# Patient Record
Sex: Male | Born: 1955 | Race: Black or African American | Hispanic: No | Marital: Single
Health system: Southern US, Community
[De-identification: ages and names within clinical notes are randomized; demographics above are authoritative.]

---

## 2019-11-15 ENCOUNTER — Emergency Department (HOSPITAL_COMMUNITY): Payer: Medicare HMO

## 2019-11-15 ENCOUNTER — Emergency Department (HOSPITAL_COMMUNITY)
Admission: EM | Admit: 2019-11-15 | Discharge: 2019-11-15 | Disposition: A | Discharge status: Home / Self Care | Payer: Medicare HMO | Attending: Emergency Medicine | Admitting: Emergency Medicine

## 2019-11-15 ENCOUNTER — Encounter (HOSPITAL_COMMUNITY): Payer: Self-pay

## 2019-11-15 ENCOUNTER — Other Ambulatory Visit: Payer: Self-pay

## 2019-11-15 DIAGNOSIS — Y939 Activity, unspecified: Secondary | ICD-10-CM | POA: Diagnosis not present

## 2019-11-15 DIAGNOSIS — S61302A Unspecified open wound of right middle finger with damage to nail, initial encounter: Secondary | ICD-10-CM | POA: Diagnosis not present

## 2019-11-15 DIAGNOSIS — Z23 Encounter for immunization: Secondary | ICD-10-CM | POA: Diagnosis not present

## 2019-11-15 DIAGNOSIS — W231XXA Caught, crushed, jammed, or pinched between stationary objects, initial encounter: Secondary | ICD-10-CM | POA: Insufficient documentation

## 2019-11-15 DIAGNOSIS — Y9259 Other trade areas as the place of occurrence of the external cause: Secondary | ICD-10-CM | POA: Diagnosis not present

## 2019-11-15 DIAGNOSIS — Y999 Unspecified external cause status: Secondary | ICD-10-CM | POA: Diagnosis not present

## 2019-11-15 DIAGNOSIS — S61319A Laceration without foreign body of unspecified finger with damage to nail, initial encounter: Secondary | ICD-10-CM

## 2019-11-15 DIAGNOSIS — I10 Essential (primary) hypertension: Secondary | ICD-10-CM | POA: Insufficient documentation

## 2019-11-15 DIAGNOSIS — S61312A Laceration without foreign body of right middle finger with damage to nail, initial encounter: Secondary | ICD-10-CM | POA: Insufficient documentation

## 2019-11-15 DIAGNOSIS — S62639B Displaced fracture of distal phalanx of unspecified finger, initial encounter for open fracture: Secondary | ICD-10-CM

## 2019-11-15 MED ORDER — BUPIVACAINE HCL (PF) 0.5 % IJ SOLN
10.0000 mL | Freq: Once | INTRAMUSCULAR | Status: AC
  Administered 2019-11-15: 10 mL
  Filled 2019-11-15: qty 30

## 2019-11-15 MED ORDER — CEPHALEXIN 500 MG PO CAPS: 500.0000 mg | ORAL_CAPSULE | Freq: Three times a day (TID) | ORAL | 0 refills | Status: AC

## 2019-11-15 MED ORDER — LIDOCAINE HCL (PF) 1 % IJ SOLN
5.0000 mL | Freq: Once | INTRAMUSCULAR | Status: AC
  Administered 2019-11-15: 5 mL
  Filled 2019-11-15: qty 30

## 2019-11-15 MED ORDER — CEPHALEXIN 500 MG PO CAPS
500.0000 mg | ORAL_CAPSULE | Freq: Once | ORAL | Status: AC
  Administered 2019-11-15: 500 mg via ORAL
  Filled 2019-11-15: qty 1

## 2019-11-15 MED ORDER — TETANUS-DIPHTH-ACELL PERTUSSIS 5-2.5-18.5 LF-MCG/0.5 IM SUSP
0.5000 mL | Freq: Once | INTRAMUSCULAR | Status: AC
  Administered 2019-11-15: 0.5 mL via INTRAMUSCULAR
  Filled 2019-11-15: qty 0.5

## 2019-11-15 NOTE — ED Triage Notes (Signed)
Pt cut his R middle finger in a folding chair. Pt is intoxicated. Deep laceration noted and fingernail has started to come away from finger.

## 2019-11-15 NOTE — ED Notes (Signed)
Pt given bus pass and printed bus route to get back to his hotel.

## 2019-11-15 NOTE — ED Notes (Signed)
Pt declined dc vitals.

## 2019-11-15 NOTE — ED Provider Notes (Signed)
[facility]-EMERGENCY DEPT Provider Note   CSN: [id number] Arrival date & time: 11/15/19  0107     History Chief Complaint  Patient presents with  . Finger Injury    R middle    Jerome Chan is a 64 y.o. male with a history of hypertension & osteoarthritis who presents to the ED with complaints of R 3rd finger injury which occurred around midnight. Patient states he was closing a folding chair and got the tip of his finger pinched in it resulting in a laceration. Patient states the area is not overly painful. Had some continued bleeding therefore came to the ED for assessment. No alleviating/aggravating factors. He is L hand dominant. Denies numbness, tingling, weakness, or other areas of injury. Unknown last tetanus. Has been drinking alcohol tonight. Last ate yesterday AM, Last drink was shortly PTA.   HPI     History reviewed. No pertinent past medical history.  There are no problems to display for this patient.   History reviewed. No pertinent surgical history.     History reviewed. No pertinent family history.  Social History   Tobacco Use  . Smoking status: Not on file  Substance Use Topics  . Alcohol use: Not on file  . Drug use: Not on file    Home Medications Prior to Admission medications   Not on File    Allergies    Patient has no known allergies.  Review of Systems   Review of Systems  Constitutional: Negative for chills and fever.  Respiratory: Negative for shortness of breath.   Cardiovascular: Negative for chest pain.  Musculoskeletal: Negative for arthralgias.  Skin: Positive for wound.  Neurological: Negative for weakness, numbness and headaches.  All other systems reviewed and are negative.   Physical Exam Updated Vital Signs BP 103/88 (BP Location: Left Arm)   Pulse 84   Temp 98.5 F (36.9 C) (Oral)   Resp 18   Ht 5\' 7"  (1.702 m)   Wt 93 kg   SpO2 96%   BMI 32.11 kg/m   Physical Exam Vitals and  nursing note reviewed.  Constitutional:      General: He is not in acute distress.    Appearance: Normal appearance. He is not ill-appearing or toxic-appearing.  HENT:     Head: Normocephalic and atraumatic.  Neck:     Comments: No midline tenderness.  Cardiovascular:     Rate and Rhythm: Normal rate.     Pulses:          Radial pulses are 2+ on the right side and 2+ on the left side.  Pulmonary:     Effort: Pulmonary effort is normal. No respiratory distress.  Musculoskeletal:     Cervical back: Normal range of motion and neck supple.     Comments: Upper extremities: R 3rd finger: Patient has avulsed the distal aspect of the nailbed with 3cm laceration through the nailbed extending to the radial aspect of the digit and to the tip of the digit. Pulsatile bleeding noted to the radial aspect of the nailbed- pressure applied.  Able to visualize and palpate bone within laceration.  He is able to move all joints. Tenderness to distal phalanx.   Skin:    General: Skin is warm and dry.  Neurological:     Mental Status: He is alert.     Comments: Alert. Mildly slurred speech at times. Sensation grossly intact to bilateral upper extremities. 5/5 symmetric grip strength. Ambulatory.   Psychiatric:  Mood and Affect: Mood normal.        Behavior: Behavior normal.             ED Results / Procedures / Treatments   Labs (all labs ordered are listed, but only abnormal results are displayed) Labs Reviewed - No data to display  EKG None  Radiology DG Finger Middle Right  Result Date: 11/15/2019 CLINICAL DATA:  Folding chair injury, deep laceration EXAM: RIGHT MIDDLE FINGER 2+V COMPARISON:  None. FINDINGS: There is extensive overlying laceration difficult to fully assess given saturated bandaging material. A small crescentic fracture of the third distal phalangeal tuft, possibly open fracture injury with disruption of the nail plate. No other acute fracture or traumatic osseous  abnormality is seen. IMPRESSION: 1. Extensive overlying laceration, difficult to fully assess given saturated bandaging material. 2. Crescentic fracture of the third distal phalangeal tuft, correlate for open fracture. 3. Disruption of the nail plate. Electronically Signed   By: Kreg ShropshirePrice  DeHay M.D.   On: 11/15/2019 01:40    Procedures .Nail Removal  Date/Time: 11/15/2019 3:22 AM Performed by: Cherly AndersonPetrucelli, Monseratt Ledin R, PA-C Authorized by: Cherly AndersonPetrucelli, Bryar Dahms R, PA-C   Consent:    Consent obtained:  Verbal   Consent given by:  Patient   Risks discussed:  Bleeding, incomplete removal, infection, permanent nail deformity and pain   Alternatives discussed:  No treatment Location:    Hand:  R long finger Pre-procedure details:    Skin preparation:  Alcohol and Betadine   Preparation: Patient was prepped and draped in the usual sterile fashion   Anesthesia (see MAR for exact dosages):    Anesthesia method:  Nerve block   Block needle gauge:  27 G   Block anesthetic:  Lidocaine 1% w/o epi and bupivacaine 0.5% w/o epi   Block injection procedure:  Anatomic landmarks identified, anatomic landmarks palpated, negative aspiration for blood, introduced needle and incremental injection   Block outcome:  Anesthesia achieved Nail Removal:    Nail removed:  Complete   Nail bed repaired: yes     Nail bed suture material: See laceration repair procedure note.   Removed nail replaced and anchored: yes   Post-procedure details:    Patient tolerance of procedure:  Tolerated well, no immediate complications  .Marland Kitchen.Laceration Repair  Date/Time: 11/15/2019 3:24 AM Performed by: Cherly AndersonPetrucelli, Charod Slawinski R, PA-C Authorized by: Cherly AndersonPetrucelli, Macarena Langseth R, PA-C   Consent:    Consent obtained:  Verbal   Consent given by:  Patient   Risks discussed:  Infection, need for additional repair, nerve damage, poor wound healing, poor cosmetic result, pain, retained foreign body, tendon damage and vascular damage   Alternatives  discussed:  No treatment Anesthesia (see MAR for exact dosages):    Anesthesia method:  Nerve block   Block needle gauge:  27 G   Block anesthetic:  Lidocaine 1% w/o epi and bupivacaine 0.5% w/o epi   Block injection procedure:  Anatomic landmarks identified, anatomic landmarks palpated, negative aspiration for blood, introduced needle and incremental injection   Block outcome:  Anesthesia achieved Laceration details:    Location:  Finger   Finger location:  R long finger   Length (cm):  3   Depth (mm):  5 Repair type:    Repair type:  Intermediate Pre-procedure details:    Preparation:  Patient was prepped and draped in usual sterile fashion and imaging obtained to evaluate for foreign bodies Exploration:    Hemostasis achieved with:  Direct pressure   Wound exploration: wound explored  through full range of motion and entire depth of wound probed and visualized     Wound extent comment:  Nailbed laceration    Contaminated: no   Treatment:    Area cleansed with:  Betadine   Irrigation solution:  Sterile water   Irrigation volume:  1L   Irrigation method:  Pressure wash Skin repair:    Repair method:  Sutures   Suture size:  5-0   Wound skin closure material used: Vicryl rapide.   Suture technique:  Simple interrupted   Number of sutures:  10 Approximation:    Approximation:  Close Post-procedure details:    Dressing: Nail replaced. non-stick dressing applied. Splint.   Patient tolerance of procedure:  Tolerated well, no immediate complications   (including critical care time)  Medications Ordered in ED Medications  lidocaine (PF) (XYLOCAINE) 1 % injection 5 mL (has no administration in time range)  bupivacaine (MARCAINE) 0.5 % injection 10 mL (has no administration in time range)  Tdap (BOOSTRIX) injection 0.5 mL (has no administration in time range)    ED Course  I have reviewed the triage vital signs and the nursing notes.  Pertinent labs & imaging results that  were available during my care of the patient were reviewed by me and considered in my medical decision making (see chart for details).    MDM Rules/Calculators/A&P                         Patient presents to the ED with complaints of R 3rd finger injury.  Tourniquet applied briefly for more thorough assessment & for images above, removed.  Imaging Studies ordered:  I ordered imaging studies which included R 3rd finger x-ray, I independently visualized and interpreted imaging which showed 1. Extensive overlying laceration, difficult to fully assess given saturated bandaging material. 2. Crescentic fracture of the third distal phalangeal tuft, correlate for open fracture. 3. Disruption of the nail plate.   Findings consistent w/ open distal phalanx tuft fracture with nailbed laceration and findings concerning for small arterial bleed. Will discuss with hand surgery on call. Discussed with supervising physician Dr. Adela Lank who is in agreement.   02:21: CONSULT: Discussed case with hand surgeon Dr. Janee Morn, recommends nail removal, laceration repair with absorbable sutures, and discharged home on Keflex for a 2-3 days.  His office will contact for follow-up appointment.  Appreciate consultation.  Wound irrigated in a bloodless field without evidence of foreign body.  With application of pressure and observation bleeding has resolved.  Repair per procedure note above.  Tolerated well. I discussed results, treatment plan, need for follow-up, and return precautions with the patient. Provided opportunity for questions, patient confirmed understanding and is in agreement with plan.  Findings and plan of care discussed with supervising physician Dr. Adela Lank who is in agreement.   Portions of this note were generated with Scientist, clinical (histocompatibility and immunogenetics). Dictation errors may occur despite best attempts at proofreading.   Final Clinical Impression(s) / ED Diagnoses Final diagnoses:  Open fracture of tuft of distal  phalanx of finger  Laceration of nail bed of finger, initial encounter    Rx / DC Orders ED Discharge Orders         Ordered    cephALEXin (KEFLEX) 500 MG capsule  3 times daily     Discontinue  Reprint     11/15/19 0331           Cherly Anderson, PA-C 11/15/19 [phone]  Melene Plan, DO 11/16/19 [phone]

## 2019-11-15 NOTE — Discharge Instructions (Addendum)
You were seen in the emergency department for a right third finger injury.  Your x-ray showed a fracture.  Your nail was removed and your laceration was repaired with absorbable sutures, these will dissolve on their own.  We have replaced the nail.  We have placed you in a splint.  Please keep the splint in place at all times until you have followed up with Dr. Janee Morn, hand surgeon in your discharge instructions.  They will call you to schedule a follow-up appointment.  We are sending you home with Keflex to take 3 times per day for the next 3 days to help prevent infection.  We have prescribed you new medication(s) today. Discuss the medications prescribed today with your pharmacist as they can have adverse effects and interactions with your other medicines including over the counter and prescribed medications. Seek medical evaluation if you start to experience new or abnormal symptoms after taking one of these medicines, seek care immediately if you start to experience difficulty breathing, feeling of your throat closing, facial swelling, or rash as these could be indications of a more serious allergic reaction  Please follow-up with Dr. Janee Morn as discussed above.  Return to the ER for new or worsening symptoms including but not limited to redness around the wound, pus draining from the wound, fever, chills, increased pain, or any other concerns.

## 2020-10-07 IMAGING — CR DG FINGER MIDDLE 2+V*R*
3 series · 3 of 3 positions shown · non-contrast
Comparison: None.

CLINICAL DATA: Folding chair injury, deep laceration

EXAM:
RIGHT MIDDLE FINGER 2+V

[x finger pa right]
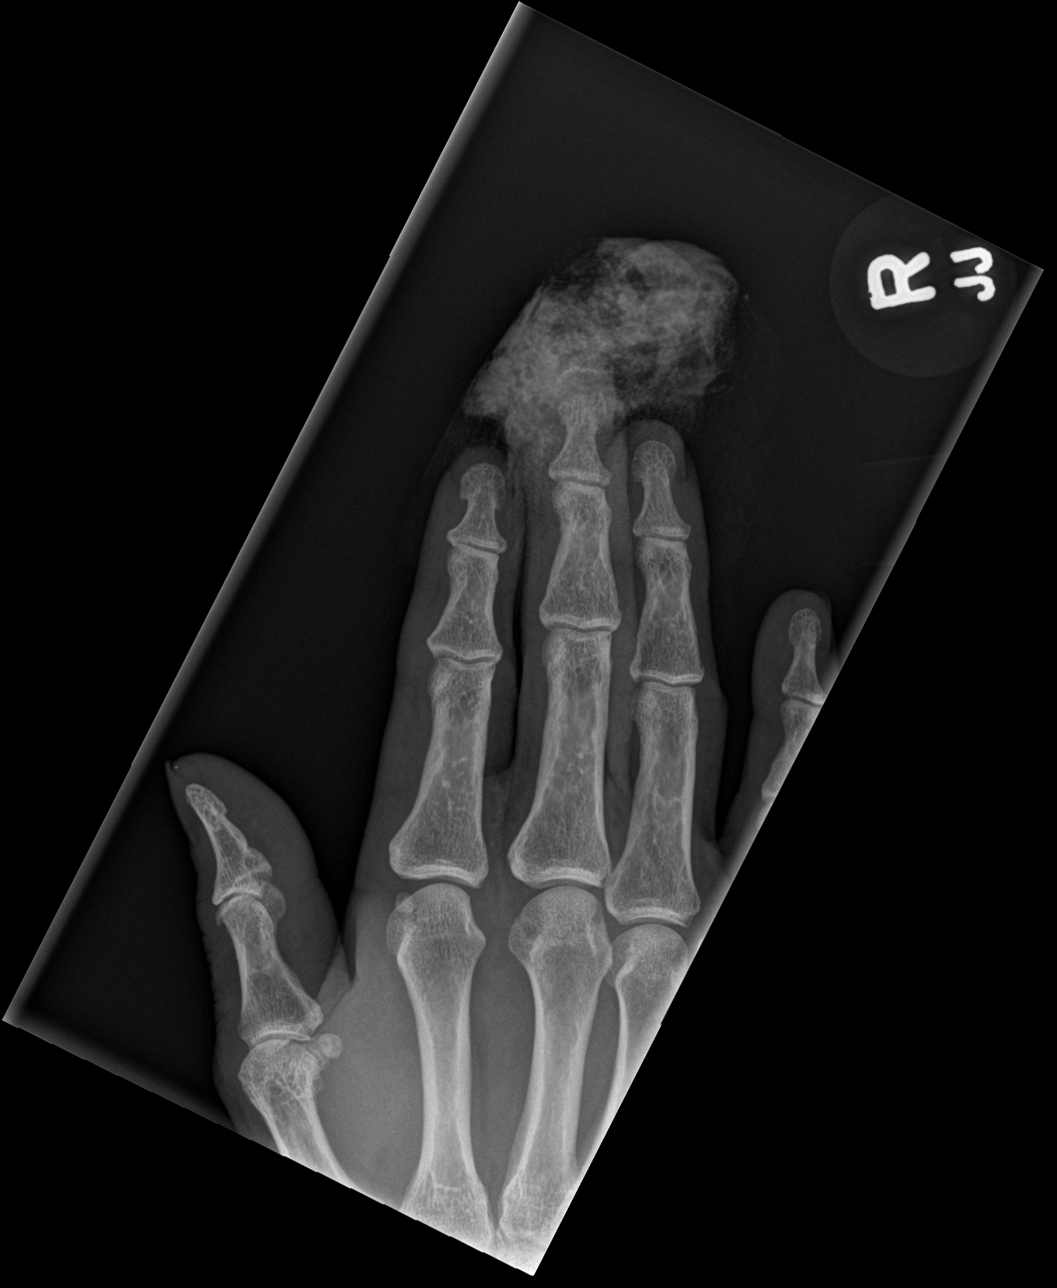

[x finger obl right]
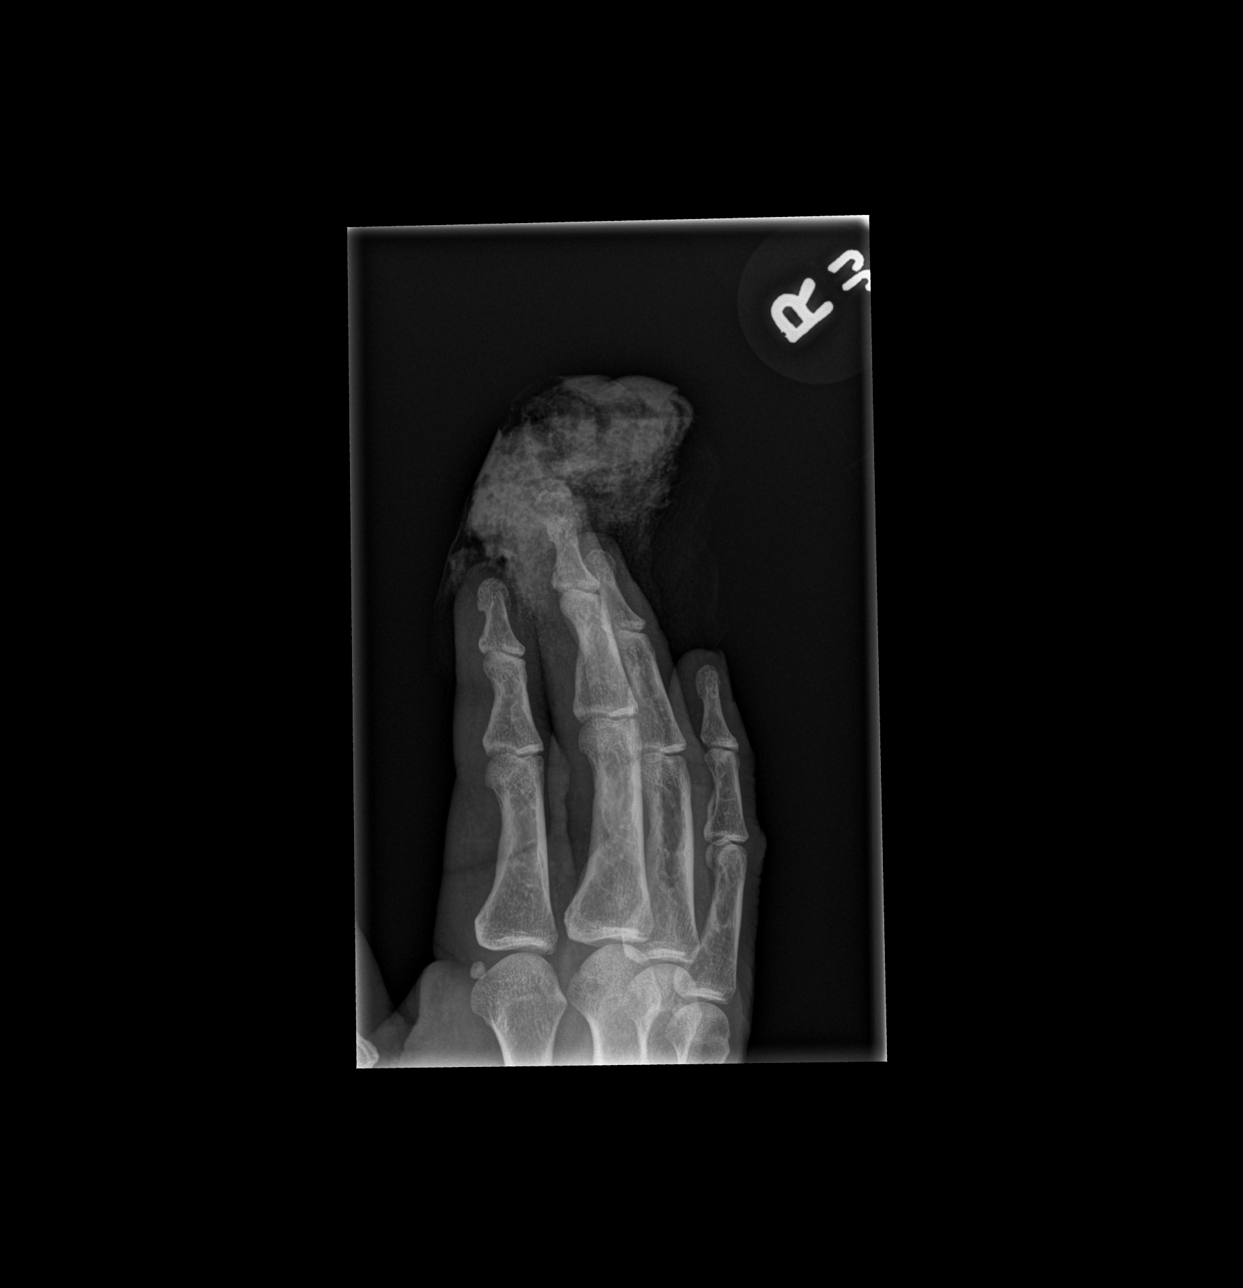

[x finger lat right]
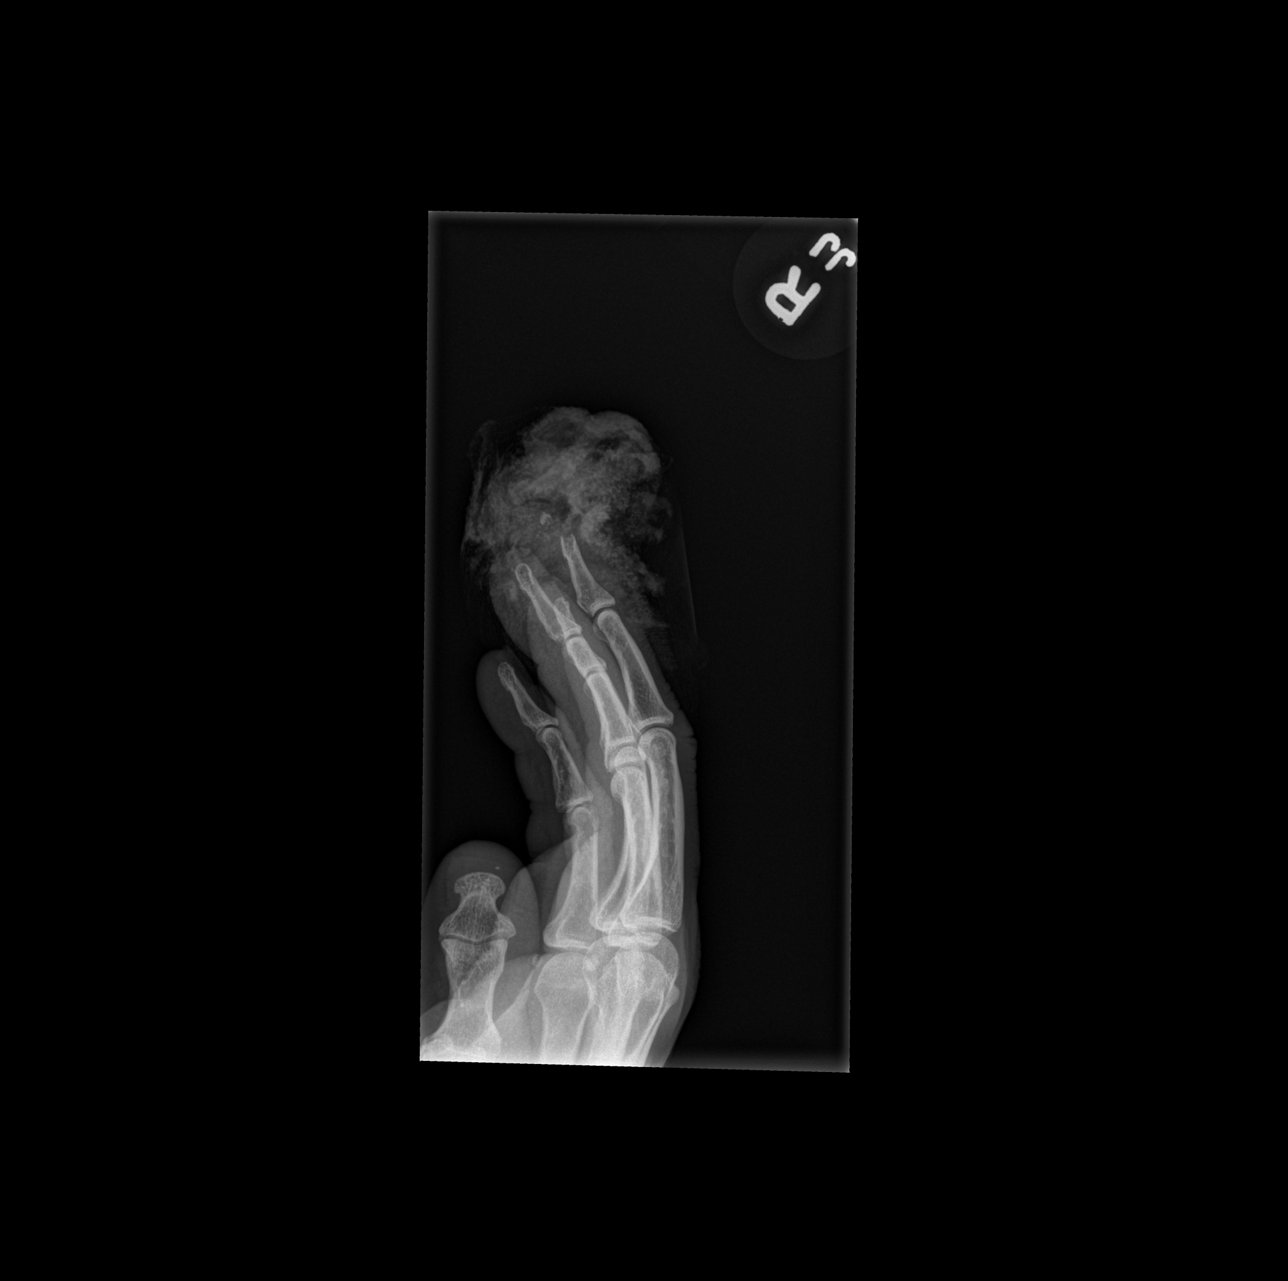

[3 of 3 positions shown; findings below may reference images not displayed]

FINDINGS: There is extensive overlying laceration difficult to fully assess
given saturated bandaging material. A small crescentic fracture of
the third distal phalangeal tuft, possibly open fracture injury with
disruption of the nail plate. No other acute fracture or traumatic
osseous abnormality is seen.
IMPRESSION: 1. Extensive overlying laceration, difficult to fully assess given
saturated bandaging material.
2. Crescentic fracture of the third distal phalangeal tuft,
correlate for open fracture.
3. Disruption of the nail plate.
